# Patient Record
Sex: Female | Born: 1995 | Race: White | Hispanic: No | Marital: Single | Smoking: Never smoker
Health system: Southern US, Community
[De-identification: ages and names within clinical notes are randomized; demographics above are authoritative.]

## PROBLEM LIST (undated history)

## (undated) DIAGNOSIS — M629 Disorder of muscle, unspecified: Secondary | ICD-10-CM

---
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
## 2015-04-27 ENCOUNTER — Encounter (HOSPITAL_COMMUNITY): Payer: Self-pay | Admitting: *Deleted

## 2015-04-27 ENCOUNTER — Emergency Department (HOSPITAL_COMMUNITY)
Admission: EM | Admit: 2015-04-27 | Discharge: 2015-04-27 | Disposition: A | Discharge status: Home / Self Care | Payer: Managed Care, Other (non HMO) | Attending: Emergency Medicine | Admitting: Emergency Medicine

## 2015-04-27 DIAGNOSIS — M542 Cervicalgia: Secondary | ICD-10-CM | POA: Insufficient documentation

## 2015-04-27 DIAGNOSIS — R509 Fever, unspecified: Secondary | ICD-10-CM | POA: Diagnosis not present

## 2015-04-27 DIAGNOSIS — R110 Nausea: Secondary | ICD-10-CM | POA: Insufficient documentation

## 2015-04-27 DIAGNOSIS — R42 Dizziness and giddiness: Secondary | ICD-10-CM | POA: Diagnosis not present

## 2015-04-27 DIAGNOSIS — H9203 Otalgia, bilateral: Secondary | ICD-10-CM | POA: Diagnosis present

## 2015-04-27 HISTORY — DX: Disorder of muscle, unspecified: M62.9

## 2015-04-27 MED ORDER — MECLIZINE HCL 25 MG PO TABS: 25.0000 mg | ORAL_TABLET | Freq: Three times a day (TID) | ORAL | Status: DC | PRN

## 2015-04-27 NOTE — Discharge Instructions (Signed)

## 2015-04-27 NOTE — ED Notes (Addendum)
Patient states has had bilateral ear pain and pressure x6 days.  The pain gradually moved into her posterior neck.  Patient also endorses dizziness with or without movement.  Patient denies vomiting, but endorses nausea and states she had a temperature of 100.5 on Friday (10/22).  Patient denies photophobia and otorrhea.  Patient believes she has had the meningococcal vaccination.  Patient has treated pain with Advil with little relief.  No topicals.

## 2015-04-27 NOTE — ED Provider Notes (Signed)
CSN: [id number]     Arrival date & time 04/27/15  1238 History  By signing my name below, I, Anne Richards, attest that this documentation has been prepared under the direction and in the presence of Langston Masker, PA-C Electronically Signed: Soijett Richards, ED Scribe. 04/27/2015. 1:23 PM.   Chief Complaint  Patient presents with  . Otalgia  . Neck Pain      The history is provided by the patient. No language interpreter was used.    Anne Richards is a 19 y.o. female who presents to the Emergency Department complaining of bilateral ear pain/pressure onset 6 days. She notes that her bilateral neck pain has radiated to her neck with a sensation of tightness. She states that she is having associated symptoms of dizziness that feels like her head is spinning, fever of 100.5 2 days ago, and nausea. She states that she has tried Geneticist, molecular for her symptoms. She denies chills, color change, rash, wound, and any other symptoms. 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 Pt PCP is Dr. Abundio Miu in [place] and she goes to [employer].   Past Medical History  Diagnosis Date  . Muscle disorder     no specific dx, sx fatigue, muscle aches, headaches   History reviewed. No pertinent past surgical history. No family history on file. Social History  Substance Use Topics  . Smoking status: Never Smoker   . Smokeless tobacco: Never Used  . Alcohol Use: No   OB History    No data available     Review of Systems  Constitutional: Positive for fever.  HENT: Positive for ear pain.   Gastrointestinal: Positive for nausea. Negative for vomiting.  Musculoskeletal: Positive for neck pain (soreness).  Skin: Negative for color change, rash and wound.  Neurological: Positive for dizziness.      Allergies  Review of patient's allergies indicates no known allergies.  Home Medications    Prior to Admission medications   Medication Sig Start Date End Date Taking? Authorizing Provider  ibuprofen (ADVIL) 200 MG tablet Take 600 mg by mouth every 6 (six) hours as needed for moderate pain.   Yes Historical Provider, MD   BP 135/78 mmHg  Pulse 70  Temp(Src) 98.3 F (36.8 C)  Resp 18  SpO2 100%  LMP 03/23/2015 Physical Exam  Constitutional: She is oriented to person, place, and time. She appears well-developed and well-nourished. No distress.  HENT:  Head: Normocephalic and atraumatic.  Left Ear: Tympanic membrane normal.  Fluid behind right TM. Left TM nl.  Eyes: EOM are normal.  Neck: Normal range of motion. Neck supple.  Cardiovascular: Normal rate, regular rhythm and normal heart sounds.  Exam reveals no gallop and no friction rub.   No murmur heard. Pulmonary/Chest: Effort normal and breath sounds normal. No respiratory distress. She has no wheezes. She has no rales.  Musculoskeletal: Normal range of motion.  Neurological: She is alert and oriented to person, place, and time.  Skin: Skin is warm and dry.  Psychiatric: She has a normal mood and affect. Her behavior is normal.  Nursing note and vitals reviewed.   ED Course  Procedures (including critical care time) DIAGNOSTIC STUDIES: Oxygen Saturation is 100% on RA, nl by my interpretation.    COORDINATION OF CARE: 1:22 PM Discussed treatment plan with pt at bedside which includes antivert Rx and return if the symptoms worsen  or persist and pt agreed to plan.    Labs Review Labs Reviewed - No data to display  Imaging Review No results found.    EKG Interpretation None      MDM   Final diagnoses:  Vertigo    antivert [facility] 2 days  Elson AreasLeslie K Vergene Marland, PA-C 04/27/15 1333  Gwyneth SproutWhitney Plunkett, MD 04/27/15 [phone]

## 2017-04-05 ENCOUNTER — Encounter: Payer: Self-pay | Admitting: Urgent Care

## 2017-04-05 ENCOUNTER — Ambulatory Visit (INDEPENDENT_AMBULATORY_CARE_PROVIDER_SITE_OTHER): Payer: Managed Care, Other (non HMO)

## 2017-04-05 ENCOUNTER — Ambulatory Visit (INDEPENDENT_AMBULATORY_CARE_PROVIDER_SITE_OTHER): Payer: Managed Care, Other (non HMO) | Admitting: Urgent Care

## 2017-04-05 VITALS — BP 122/80 | HR 83 | Temp 98.4°F | Resp 16 | Ht 67.0 in | Wt 168.2 lb

## 2017-04-05 DIAGNOSIS — F0781 Postconcussional syndrome: Secondary | ICD-10-CM

## 2017-04-05 DIAGNOSIS — S0990XA Unspecified injury of head, initial encounter: Secondary | ICD-10-CM

## 2017-04-05 NOTE — Progress Notes (Signed)
    MRN: [id number] DOB: 1996-05-13  Subjective:   Anne Richards is a 21 y.o. female presenting for chief complaint of Concussion (possible concussion; hit in head 1 week ago); Dizziness (from hit in head ); and lump (lump behind right ear; possible from hit)  Reports 1 week history of trauma to the head while playing volleyball. She was warming up and suffered two hits to the posterior head and upper neck twice. She had progressive symptoms over the next 2 days that have steadily improved up to today. Symptoms include mild confusion, disorientation, intermittent dizziness, neck pain, pain over posterior lateral head behind right ear. Has also had photosensitivity, right facial tingling. Has a history of concussions, this would be 4th episode. Denies fevers, double vision, loss of consciousness, headaches, lethargy, falls, weakness, difficulty ambulating, difficulty with balance. She has rested from volleyball, had her mid-terms rescheduled this past week. She is not hydrating well.   Leighann is not currently taking any medications. Also has No Known Allergies.  Toree  has a past medical history of Muscle disorder. Denies past surgical history.  Objective:   Vitals: BP 122/80   Pulse 83   Temp 98.4 F (36.9 C) (Oral)   Resp 16   Ht  (1.702 m)   Wt 168 lb 3.2 oz (76.3 kg)   SpO2 100%   BMI 26.34 kg/m   Physical Exam  Constitutional: She is oriented to person, place, and time. She appears well-developed and well-nourished.  HENT:  TM's intact bilaterally, no effusions or erythema. Oropharynx clear, mucous membranes moist.  Eyes: Pupils are equal, round, and reactive to light. EOM are normal. No scleral icterus.  Neck: Normal range of motion. Neck supple.  Cardiovascular: Normal rate, regular rhythm and intact distal pulses.  Exam reveals no gallop and no friction rub.   No murmur heard. Pulmonary/Chest: Effort normal. No respiratory distress. She has no wheezes. She has no rales.    Musculoskeletal:       Cervical back: She exhibits normal range of motion, no tenderness, no bony tenderness, no swelling, no edema, no deformity, no laceration, no pain, no spasm and normal pulse.  Neurological: She is alert and oriented to person, place, and time. She displays normal reflexes. No cranial nerve deficit. Coordination normal.  Heel-to-shin, finger-to-nose, balance intact.  Skin: Skin is warm and dry. Capillary refill takes less than 2 seconds. No rash noted.  Psychiatric: She has a normal mood and affect.   Dg Skull Complete  Result Date: 04/05/2017 CLINICAL DATA:  Posterior head injury EXAM: SKULL - COMPLETE 4 + VIEW COMPARISON:  None. FINDINGS: There is no evidence of skull fracture or other focal bone lesions. IMPRESSION: Negative.  CT follow-up as indicated. Electronically Signed   By: Jasmine Pang M.D.   On: 04/05/2017 16:36   Assessment and Plan :   1. Post concussion syndrome 2. Injury of head, initial encounter - Patient has excellent physical exam findings. Anticipatory guidance provided. Patient provided with school note. Progressive return to sports as dictated by her program. Return-to-clinic precautions discussed, patient verbalized understanding.   Wallis Bamberg, PA-C Primary Care at [facility] [phone] 04/05/2017  4:03 PM

## 2017-04-05 NOTE — Patient Instructions (Addendum)
Make sure you are hydrating very well. Allow for brain rest. Use naproxen 200-440mg  (1-2 tablets) twice daily with food.    Concussion, Adult A concussion is a brain injury from a direct hit (blow) to the head or body. This injury causes the brain to shake quickly back and forth inside the skull. It is caused by:  A hit to the head.  A quick and sudden movement (jolt) of the head or neck.  How fast you will get better from a concussion depends on many things like how bad your concussion was, what part of your brain was hurt, how old you are, and how healthy you were before the concussion. Recovery can take time. It is important to wait to return to activity until a doctor says it is safe and your symptoms are all gone. Follow these instructions at home: Activity  Limit activities that need a lot of thought or concentration. These include: ? Homework or work for your job. ? Watching TV. ? Computer work. ? Playing memory games and puzzles.  Rest. Rest helps the brain to heal. Make sure you: ? Get plenty of sleep at night. Do not stay up late. ? Go to bed at the same time every day. ? Rest during the day. Take naps or rest breaks when you feel tired.  It can be dangerous if you get another concussion before the first one has healed Do not do activities that could cause a second concussion, such as riding a bike or playing sports.  Ask your doctor when you can return to your normal activities, like driving, riding a bike, or using machinery. Your ability to react may be slower. Do not do these activities if you are dizzy. Your doctor will likely give you a plan for slowly going back to activities. General instructions  Take over-the-counter and prescription medicines only as told by your doctor.  Do not drink alcohol until your doctor says you can.  If it is harder than usual to remember things, write them down.  If you are easily distracted, try to do one thing at a time. For  example, do not try to watch TV while making dinner.  Talk with family members or close friends when you need to make important decisions.  Watch your symptoms and tell other people to do the same. Other problems (complications) can happen after a concussion. Older adults with a brain injury may have a higher risk of serious problems, such as a blood clot in the brain.  Tell your teachers, school nurse, school counselor, coach, Event organiser, or work Production designer, theatre/television/film about your injury and symptoms. Tell them about what you can or cannot do. They should watch for: ? More problems with attention or concentration. ? More trouble remembering or learning new information. ? More time needed to do tasks or assignments. ? Being more annoyed (irritable) or having a harder time dealing with stress. ? Any other symptoms that get worse.  Keep all follow-up visits as told by your health care provider. This is important. Prevention  It is very important that you donot get another brain injury, especially before you have healed. In rare cases, another injury can cause permanent brain damage, brain swelling, or death. You have the most risk if you get another head injury in the first 7-10 days after you were hurt before. To avoid injuries: ? Wear a seat belt when you ride in a car. ? Do not drink too much alcohol. ? Avoid activities  that could make you get a second concussion, like contact sports. ? Wear a helmet when you do activities like:  Biking.  Skiing.  Skateboarding.  Skating. ? Make your home safe by:  Removing things from the floor or stairs that could make you trip.  Using grab bars in bathrooms and handrails by stairs.  Placing non-slip mats on floors and in bathtubs.  Putting more light in dark areas. Contact a doctor if:  Your symptoms get worse.  You have new symptoms.  You keep having symptoms for more than 2 weeks. Get help right away if:  You have bad headaches, or your  headaches get worse.  You have weakness in any part of your body.  You have loss of feeling (numbness).  You feel off balance.  You keep throwing up (vomiting).  You feel more sleepy.  The black center of one eye (pupil) is bigger than the other one.  You twitch or shake violently (convulse) or have a seizure.  Your speech is not clear (is slurred).  You feel more tired, more confused, or more annoyed.  You do not recognize people or places.  You have neck pain.  It is hard to wake you up.  You have strange behavior changes.  You pass out (lose consciousness). Summary  A concussion is a brain injury from a direct hit (blow) to the head or body.  This condition is treated with rest and careful watching of symptoms.  If you keep having symptoms for more than 2 weeks, call your doctor. This information is not intended to replace advice given to you by your health care provider. Make sure you discuss any questions you have with your health care provider. Document Released: 06/09/2009 Document Revised: 06/05/2016 Document Reviewed: 06/05/2016 Elsevier Interactive Patient Education  2017 [employer].     IF you received an x-ray today, you will receive an invoice from [facility]. Please contact [facility] at [phone] with questions or concerns regarding your invoice.   IF you received labwork today, you will receive an invoice from [facility]. Please contact [facility] at [phone] with questions or concerns regarding your invoice.   Our billing staff will not be able to assist you with questions regarding bills from these companies.  You will be contacted with the lab results as soon as they are available. The fastest way to get your results is to activate your My Chart account. Instructions are located on the last page of this paperwork. If you have not heard from [place] regarding the results in 2 weeks, please contact this office.

## 2017-04-27 ENCOUNTER — Emergency Department (HOSPITAL_COMMUNITY): Payer: Managed Care, Other (non HMO)

## 2017-04-27 ENCOUNTER — Emergency Department (HOSPITAL_COMMUNITY)
Admission: EM | Admit: 2017-04-27 | Discharge: 2017-04-27 | Disposition: A | Discharge status: Home / Self Care | Payer: Managed Care, Other (non HMO) | Attending: Physician Assistant | Admitting: Physician Assistant

## 2017-04-27 ENCOUNTER — Encounter (HOSPITAL_COMMUNITY): Payer: Self-pay | Admitting: Emergency Medicine

## 2017-04-27 DIAGNOSIS — F0781 Postconcussional syndrome: Secondary | ICD-10-CM | POA: Insufficient documentation

## 2017-04-27 DIAGNOSIS — R51 Headache: Secondary | ICD-10-CM | POA: Insufficient documentation

## 2017-04-27 NOTE — ED Triage Notes (Signed)
Pt complaint of worsening headache since earlier October with palpation and growth "of lump behind my right ear."

## 2017-04-27 NOTE — ED Provider Notes (Signed)
[facility]-EMERGENCY DEPT Provider Note   CSN: [id number] Arrival date & time: 04/27/17  1355     History   Chief Complaint Chief Complaint  Patient presents with  . Headache    HPI Anne Richards is a 21 y.o. female who presents the emergency department today for headache.  Patient states that she was initially hit in head by volleyball x 2 on September 26 while at game for college.  She was diagnosed by her schools doctor with a concussion.  Over the next several days she started noticing a bump on the back of her right ear (where she as hit) with continued headache, confusion, disorientation, intermittent dizziness, and neck pain. She was seen at urgent care on 04/05/17 where a skull x-ray was performed and found no evidence of fracture or focal bony lesion.  At this time the patient was neurologically intact and was diagnosed with post-concussion symptoms.  The patient states that her symptoms started to resolve/resolved 1 week later (October 7).  She was cleared for play on October 15 by her athletic trainer is it present with her today.  She played her first  game on the 20th of October and felt her symptoms worsen yet again. No re-trauma.  She has now  been having continued right-sided, throbbing headache with associated phonophobia that she rates as a 4/10, nausea, pain located behind the right ear, and intermittent dizziness that she describes as unbalanced (no vertigo). The patient has a history of concussions and states this was her fourth.  She denies any fevers, chills, emesis, double vision, visual changes, loss of consciousness, lethargy, upper extremity or lower extremity weakness/numbness/tingling, difficulty walking. The patient is not on blood thinners.    HPI  Past Medical History:  Diagnosis Date  . Muscle disorder    no specific dx, sx fatigue, muscle aches, headaches    There are no active problems to display for this patient.   History reviewed.  No pertinent surgical history.  OB History    No data available       Home Medications    Prior to Admission medications   Not on File    Family History No family history on file.  Social History Social History  Substance Use Topics  . Smoking status: Never Smoker  . Smokeless tobacco: Never Used  . Alcohol use No     Allergies   Patient has no known allergies.   Review of Systems Review of Systems  All other systems reviewed and are negative.    Physical Exam Updated Vital Signs BP 126/81 (BP Location: Right Arm)   Pulse 72   Temp 97.8 F (36.6 C) (Oral)   Resp 16   LMP 04/24/2017   SpO2 100%   Physical Exam  Constitutional: She appears well-developed and well-nourished.  Non-toxic appearing  HENT:  Head: Normocephalic and atraumatic. Head is without raccoon's eyes and without Battle's sign.  Right Ear: Hearing, tympanic membrane, external ear and ear canal normal. Tympanic membrane is not perforated and not erythematous. No hemotympanum.  Left Ear: Hearing, tympanic membrane, external ear and ear canal normal. Tympanic membrane is not perforated and not erythematous. No hemotympanum.  Nose: Nose normal. No rhinorrhea. Right sinus exhibits no maxillary sinus tenderness and no frontal sinus tenderness. Left sinus exhibits no maxillary sinus tenderness and no frontal sinus tenderness.  Mouth/Throat: Uvula is midline, oropharynx is clear and moist and mucous membranes are normal.  No CSF otorrhea.  No palpable open  or depressed skull fracture.  The patient is without mastoid tenderness or swelling.  There is no erythema or bogginess.  Eyes: Pupils are equal, round, and reactive to light. Conjunctivae, EOM and lids are normal. Right eye exhibits no discharge. Left eye exhibits no discharge. Right conjunctiva is not injected. Left conjunctiva is not injected. No scleral icterus. Right eye exhibits normal extraocular motion and no nystagmus. Left eye exhibits normal  extraocular motion and no nystagmus.  Neck: Trachea normal, normal range of motion, full passive range of motion without pain and phonation normal. Neck supple. No spinous process tenderness and no muscular tenderness present. No neck rigidity. No tracheal deviation and normal range of motion present.  Cardiovascular: Normal rate, regular rhythm, normal heart sounds and intact distal pulses.   Pulses:      Radial pulses are 2+ on the right side, and 2+ on the left side.       Dorsalis pedis pulses are 2+ on the right side, and 2+ on the left side.       Posterior tibial pulses are 2+ on the right side, and 2+ on the left side.  Pulmonary/Chest: Effort normal and breath sounds normal. No respiratory distress.  Musculoskeletal:  Cervical Spine: Appearance normal. No obvious bony deformity. No skin swelling, erythema, heat, fluctuance or break of the skin. No TTP over the cervical spinous processes. No paraspinal tenderness. No step-offs. Patient is able to actively rotate their neck 45 degrees left and right voluntarily without pain and flex and extend the neck without pain. Negative Spurling's test.  Radial Pulse 2+. Cap refill <2 seconds. SILT for M/U/R distributions. Compartments soft.    Neurological: She is alert. She has normal strength and normal reflexes. No cranial nerve deficit or sensory deficit. She displays a negative Romberg sign. Coordination and gait normal.  Reflex Scores:      Bicep reflexes are 2+ on the right side and 2+ on the left side.      Patellar reflexes are 2+ on the right side and 2+ on the left side.      Achilles reflexes are 2+ on the right side and 2+ on the left side. Mental Status: Alert, oriented, thought content appropriate, able to give a coherent history. Speech fluent without evidence of aphasia. Able to follow 2 step commands without difficulty. Cranial Nerves: II: Peripheral visual fields grossly normal, pupils equal, round, reactive to light III,IV,  VI: ptosis not present, extra-ocular motions intact bilaterally V,VII: smile symmetric, eyebrows raise symmetric, facial light touch sensation equal VIII: hearing grossly normal to voice X: uvula elevates symmetrically XI: bilateral shoulder shrug symmetric and strong XII: midline tongue extension without fassiculations Motor: Normal tone. 5/5 in upper and lower extremities bilaterally including strong and equal grip strength and dorsiflexion/plantar flexion Sensory: Sensation intact to light touch in all extremities.Negative Romberg.  Deep Tendon Reflexes: 2+ and symmetric in the biceps and patella Cerebellar: normal finger-to-nose with bilateral upper extremities. Normal heel-to -shin balance bilaterally of the lower extremity. No pronator drift.  Gait: normal gait and balance CV: distal pulses palpable throughout   Skin: Skin is warm, dry and intact. She is not diaphoretic. No pallor.  Psychiatric: She has a normal mood and affect.  Nursing note and vitals reviewed.    ED Treatments / Results  Labs (all labs ordered are listed, but only abnormal results are displayed) Labs Reviewed - No data to display  EKG  EKG Interpretation None       Radiology Ct  Head Wo Contrast  Result Date: 04/27/2017 CLINICAL DATA:  Post concussion syndrome, suffered a concussion 4 weeks ago, for concussions in past, 2 in the last 6 months EXAM: CT HEAD WITHOUT CONTRAST TECHNIQUE: Contiguous axial images were obtained from the base of the skull through the vertex without intravenous contrast. COMPARISON:  None FINDINGS: Brain: Normal ventricular morphology. No midline shift or mass effect. Normal appearance of brain parenchyma. No intracranial hemorrhage, mass lesion, or evidence of acute infarction. No extra-axial fluid collections. Vascular: Normal appearance Skull: Normal appearance Sinuses/Orbits: Normal appearance Other: N/A IMPRESSION: Normal exam. Electronically Signed   By: Ulyses Southward  M.D.   On: 04/27/2017 17:44    Procedures Procedures (including critical care time)  Medications Ordered in ED Medications - No data to display   Initial Impression / Assessment and Plan / ED Course  I have reviewed the triage vital signs and the nursing notes.  Pertinent labs & imaging results that were available during my care of the patient were reviewed by me and considered in my medical decision making (see chart for details).      Patient with continued post-concussive like symptoms. Patient with no focal neurological deficits on physical exam.  Discussed the likely etiology of patient's symptoms being postconcussive syndrome and advised CT is not indicated at this time.  However the patient and her athletic trainer stated that they would not like to leave the hospital until a CT scan is done as this is the patient's fourth concussion and she is continuing of symptoms.  She is aware of the risks versus benefits of this.    CT scan negative.   Discussed thoroughly symptoms to return to the emergency department including severe headaches, disequilibrium, vomiting, double vision, extremity weakness, difficulty ambulating, or any other concerning symptoms. Patient will be discharged with information pertaining to diagnosis and advised to use over-the-counter medications like NSAIDs and Tylenol for pain relief. Pt has also advised to not participate in contact sports until they are completely asymptomatic for at least 1 week or they are cleared by their doctor.    Final Clinical Impressions(s) / ED Diagnoses   Final diagnoses:  Post concussion syndrome    New Prescriptions New Prescriptions   No medications on file     Princella Pellegrini 04/27/17 Luna Glasgow, MD 04/27/17 [phone]

## 2017-04-27 NOTE — ED Notes (Signed)
Pt states that she had a concussion in Sept 28th and reports that she is c/o concussion like symptoms x 3 weeks which include nausea, no vomiting, HA, dizziness right side neck pain, pt states that she was hit in the head with a volleyball and that is how she sustained a concussion 4 weeks ago.

## 2017-04-27 NOTE — Discharge Instructions (Signed)
I have attached information on postconcussion syndrome.  Please use Tylenol and ibuprofen as needed for symptoms.  I have referred you to a neurologist as needed.  Please do not return to activity until you are cleared for play. If you develop worsening or new concerning symptoms you can return to the emergency department for re-evaluation.

## 2017-04-28 ENCOUNTER — Other Ambulatory Visit: Payer: Self-pay

## 2017-04-28 DIAGNOSIS — F0781 Postconcussional syndrome: Secondary | ICD-10-CM

## 2017-04-28 DIAGNOSIS — H938X1 Other specified disorders of right ear: Secondary | ICD-10-CM

## 2017-05-05 ENCOUNTER — Encounter: Payer: Self-pay | Admitting: *Deleted

## 2017-05-05 ENCOUNTER — Encounter: Payer: Self-pay | Admitting: Neurology

## 2017-05-05 ENCOUNTER — Ambulatory Visit (INDEPENDENT_AMBULATORY_CARE_PROVIDER_SITE_OTHER): Payer: Managed Care, Other (non HMO) | Admitting: Neurology

## 2017-05-05 VITALS — BP 107/75 | HR 78 | Wt 172.4 lb

## 2017-05-05 DIAGNOSIS — H539 Unspecified visual disturbance: Secondary | ICD-10-CM

## 2017-05-05 DIAGNOSIS — G9389 Other specified disorders of brain: Secondary | ICD-10-CM

## 2017-05-05 DIAGNOSIS — S069X0A Unspecified intracranial injury without loss of consciousness, initial encounter: Secondary | ICD-10-CM | POA: Diagnosis not present

## 2017-05-05 DIAGNOSIS — R29818 Other symptoms and signs involving the nervous system: Secondary | ICD-10-CM | POA: Diagnosis not present

## 2017-05-05 DIAGNOSIS — F0781 Postconcussional syndrome: Secondary | ICD-10-CM

## 2017-05-05 DIAGNOSIS — R51 Headache: Secondary | ICD-10-CM | POA: Diagnosis not present

## 2017-05-05 DIAGNOSIS — R5383 Other fatigue: Secondary | ICD-10-CM | POA: Diagnosis not present

## 2017-05-05 DIAGNOSIS — G8929 Other chronic pain: Secondary | ICD-10-CM

## 2017-05-05 NOTE — Patient Instructions (Signed)
Post-Concussion Syndrome Post-concussion syndrome describes the symptoms that can occur after a head injury. These symptoms can last from weeks to months. What are the causes? It is not clear why some head injuries cause post-concussion syndrome. It can occur whether your head injury was mild or severe and whether you were wearing head protection or not. What are the signs or symptoms?  Memory difficulties.  Dizziness.  Headaches.  Double vision or blurry vision.  Sensitivity to light.  Hearing difficulties.  Depression.  Tiredness.  Weakness.  Difficulty with concentration.  Difficulty sleeping or staying asleep.  Vomiting.  Poor balance or instability on your feet.  Slow reaction time.  Difficulty learning and remembering things you have heard. How is this diagnosed? There is no test to determine whether you have post-concussion syndrome. Your health care provider may order an imaging scan of your brain, such as a CT scan, to check for other problems that may be causing your symptoms (such as a severe injury inside your skull). How is this treated? Usually, these problems disappear over time without medical care. Your health care provider may prescribe medicine to help ease your symptoms. It is important to follow up with a neurologist to evaluate your recovery and address any lingering symptoms or issues. Follow these instructions at home:  Take medicines only as directed by your health care provider. Do not take aspirin. Aspirin can slow blood clotting.  Sleep with your head slightly elevated to help with headaches.  Avoid any situation where there is potential for another head injury. This includes football, hockey, soccer, basketball, martial arts, downhill snow sports, and horseback riding. Your condition will get worse every time you experience a concussion. You should avoid these activities until you are evaluated by the appropriate follow-up health care  providers.  Keep all follow-up visits as directed by your health care provider. This is important. Contact a health care provider if:  You have increased problems paying attention or concentrating.  You have increased difficulty remembering or learning new information.  You need more time to complete tasks or assignments than before.  You have increased irritability or decreased ability to cope with stress.  You have more symptoms than before. Seek medical care if you have any of the following symptoms for more than two weeks after your injury:  Lasting (chronic) headaches.  Dizziness or balance problems.  Nausea.  Vision problems.  Increased sensitivity to noise or light.  Depression or mood swings.  Anxiety or irritability.  Memory problems.  Difficulty concentrating or paying attention.  Sleep problems.  Feeling tired all the time.  Get help right away if:  You have confusion or unusual drowsiness.  Others find it difficult to wake you up.  You have nausea or persistent, forceful vomiting.  You feel like you are moving when you are not (vertigo). Your eyes may move rapidly back and forth.  You have convulsions or faint.  You have severe, persistent headaches that are not relieved by medicine.  You cannot use your arms or legs normally.  One of your pupils is larger than the other.  You have clear or bloody discharge from your nose or ears.  Your problems are getting worse, not better. This information is not intended to replace advice given to you by your health care provider. Make sure you discuss any questions you have with your health care provider. Document Released: 12/11/2001 Document Revised: 01/09/2016 Document Reviewed: 09/26/2013 Elsevier Interactive Patient Education  2018 Elsevier Inc.  

## 2017-05-05 NOTE — Progress Notes (Signed)
[name]    Provider:  Dr Lucia Gaskins Referring Provider: [facility] dr draper  CC:  concussion  HPI:  Anne Richards is a 21 y.o. female here as a referral  for concussion. Here with her coach that provides much information.  She has had 4 concussions. One in 8th grade, it was severe and loss of consciousness for < 30 mins her head hit the ground as she was running for a ball and fell, she was taken out of sports for months and stopped softball. She has concussion hit by volleyball in the head. Last spring was third concussion and got hit in the back of the head with a ball and was out of school for a week, no loss of consciousness, no vomiting at the time, no memory loss. This one happened in September, a serve got hit in the back of the head. She felt better in a week and then relapsed. Initially she had headaches, irritability and mood change, personality change, depression and anxiety, (no history of depression and anxiety), more "sassy", headaches, ear pain, memory, concentration, confusion, dizziness, right ear pain like an ear infection. She saw the ENT on Tuesday. She has had hearing sensitivity. She sleeps well and too much some night and not so much other nights, she is tired all the time.   Reviewed notes, labs and imaging from outside physicians, which showed:    CT head 04/27/2017 showed No acute intracranial abnormalities including mass lesion or mass effect, hydrocephalus, extra-axial fluid collection, midline shift, hemorrhage, or acute infarction, large ischemic events (personally reviewed images)     Review of Systems: Patient complains of symptoms per HPI as well as the following symptoms: diplopia. Pertinent negatives and positives per HPI. All others negative.   Social History   Socioeconomic History  . Marital status: Single    Spouse name: Not on file  . Number of children: Not on file  . Years of education: some college  . Highest  education level: Not on file  Social Needs  . Financial resource strain: Not on file  . Food insecurity - worry: Not on file  . Food insecurity - inability: Not on file  . Transportation needs - medical: Not on file  . Transportation needs - non-medical: Not on file  Occupational History  . Occupation: Consulting civil engineer  Tobacco Use  . Smoking status: Never Smoker  . Smokeless tobacco: Never Used  Substance and Sexual Activity  . Alcohol use: Yes    Comment: occasionally; 1 drink last 2 months  . Drug use: No  . Sexual activity: Not on file  Other Topics Concern  . 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 Dementia Paternal Grandmother   . Diabetes Maternal Grandfather     Past Medical History:  Diagnosis Date  . Muscle disorder    no specific dx, sx fatigue, muscle aches, headaches; autoimmune per pt     Past Surgical History:  Procedure Laterality Date  . MUSCLE BIOPSY  2013    No current outpatient medications on file.   No current facility-administered medications for this visit.     Allergies as of 05/05/2017  . (No Known Allergies)    Vitals: BP 107/75   Pulse 78   Wt 172 lb 6.4 oz (  78.2 kg)   LMP 04/24/2017   BMI 27.00 kg/m  Last Weight:  Wt Readings from Last 1 Encounters:  05/05/17 172 lb 6.4 oz (78.2 kg)   Last Height:   Ht Readings from Last 1 Encounters:  04/05/17 5\' 7"  (1.702 m)   Physical exam: Exam: Gen: NAD, conversant, well nourised, well groomed                     CV: RRR, no MRG. No Carotid Bruits. No peripheral edema, warm, nontender Eyes: Conjunctivae clear without exudates or hemorrhage  Neuro: Detailed Neurologic Exam  Speech:    Speech is normal; fluent and spontaneous with normal comprehension.  Cognition:    The patient is oriented to person, place, and time;     recent and remote  memory intact;     language fluent;     normal attention, concentration,     fund of knowledge Cranial Nerves:    The pupils are equal, round, and reactive to light. The fundi are normal and spontaneous venous pulsations are present. Visual fields are full to finger confrontation. Extraocular movements are intact. Trigeminal sensation is intact and the muscles of mastication are normal. The face is symmetric. The palate elevates in the midline. Hearing intact. Voice is normal. Shoulder shrug is normal. The tongue has normal motion without fasciculations.   Coordination:    Normal finger to nose and heel to shin. Normal rapid alternating movements.   Gait:    Heel-toe and tandem gait are normal.   Motor Observation:    No asymmetry, no atrophy, and no involuntary movements noted. Tone:    Normal muscle tone.    Posture:    Posture is normal. normal erect    Strength:    Strength is V/V in the upper and lower limbs.      Sensation: intact to LT     Reflex Exam:  DTR's:    Deep tendon reflexes in the upper and lower extremities are normal bilaterally.   Toes:    The toes are downgoing bilaterally.   Clonus:    Clonus is absent.   Concussion center MRI brain      Assessment/Plan:  21 year old with head trauma, concussion syndrome. Discussed with patient at length. Rest is important in concussion recovery. Recommend shortened school days, staying home and resting , taking frequent breaks. No strenuous activity, limiting computer and reading time. Patient should not return to sports at least until the spring and only after follow up in our clinic. Encouraged patient to consider retiring from competitive sports.  Concussion clinic due to multiple sports-related concussions with worsening symptoms MRI brain to evaluate for encephalomalacia or other significant post traumatic brain lesions Labs today May write a letter for accommodations if patient requests  Orders Placed This  Encounter  Procedures  . MR BRAIN W WO CONTRAST  . CBC  . Comprehensive metabolic panel  . Thyroid Panel With TSH     Naomie DeanAntonia Damyan Corne, MD  [facility] [address] [place], [place] [id number]  Phone [phone] Fax [phone]  Cc: Dr. Margaretha Sheffieldraper

## 2017-05-06 ENCOUNTER — Telehealth: Payer: Self-pay | Admitting: *Deleted

## 2017-05-06 LAB — COMPREHENSIVE METABOLIC PANEL
ALBUMIN: 4.7 g/dL (ref 3.5–5.5)
ALK PHOS: 58 IU/L (ref 39–117)
ALT: 12 IU/L (ref 0–32)
AST: 21 IU/L (ref 0–40)
Albumin/Globulin Ratio: 1.7 (ref 1.2–2.2)
BILIRUBIN TOTAL: 0.4 mg/dL (ref 0.0–1.2)
BUN / CREAT RATIO: 17 (ref 9–23)
BUN: 13 mg/dL (ref 6–20)
CO2: 24 mmol/L (ref 20–29)
Calcium: 9.5 mg/dL (ref 8.7–10.2)
Chloride: 101 mmol/L (ref 96–106)
Creatinine, Ser: 0.76 mg/dL (ref 0.57–1.00)
GFR calc Af Amer: 130 mL/min/{1.73_m2} (ref >59)
GFR calc non Af Amer: 112 mL/min/{1.73_m2} (ref >59)
GLUCOSE: 93 mg/dL (ref 65–99)
Globulin, Total: 2.8 g/dL (ref 1.5–4.5)
Potassium: 4 mmol/L (ref 3.5–5.2)
Sodium: 139 mmol/L (ref 134–144)
Total Protein: 7.5 g/dL (ref 6.0–8.5)

## 2017-05-06 LAB — CBC
HEMOGLOBIN: 14 g/dL (ref 11.1–15.9)
Hematocrit: 41.9 % (ref 34.0–46.6)
MCH: 29.8 pg (ref 26.6–33.0)
MCHC: 33.4 g/dL (ref 31.5–35.7)
MCV: 89 fL (ref 79–97)
PLATELETS: 269 10*3/uL (ref 150–379)
RBC: 4.7 x10E6/uL (ref 3.77–5.28)
RDW: 13.4 % (ref 12.3–15.4)
WBC: 7.7 10*3/uL (ref 3.4–10.8)

## 2017-05-06 LAB — THYROID PANEL WITH TSH
FREE THYROXINE INDEX: 1.7 (ref 1.2–4.9)
T3 Uptake Ratio: 26 % (ref 24–39)
T4, Total: 6.7 ug/dL (ref 4.5–12.0)
TSH: 1.32 u[IU]/mL (ref 0.450–4.500)

## 2017-05-06 NOTE — Telephone Encounter (Signed)
-----   Message from Antonia B Ahern, MD sent at 05/06/2017 11:20 AM EDT ----- Labs normal thanks 

## 2017-05-06 NOTE — Telephone Encounter (Signed)
Called pt, voicemail box full, unable to leave message.   If she calls back, please let her know that her labs are normal.

## 2017-05-08 DIAGNOSIS — F0781 Postconcussional syndrome: Secondary | ICD-10-CM | POA: Insufficient documentation

## 2017-05-09 ENCOUNTER — Telehealth: Payer: Self-pay | Admitting: *Deleted

## 2017-05-09 NOTE — Telephone Encounter (Signed)
-----   Message from Antonia B Ahern, MD sent at 05/06/2017 11:20 AM EDT ----- Labs normal thanks 

## 2017-05-09 NOTE — Telephone Encounter (Signed)
Called, unable to LVM due to mailbox being full. If she calls back please let her know that her labs are normal.

## 2017-05-10 ENCOUNTER — Telehealth: Payer: Self-pay | Admitting: *Deleted

## 2017-05-10 NOTE — Telephone Encounter (Signed)
-----   Message from Anson FretAntonia B Ahern, MD sent at 05/06/2017 11:20 AM EDT ----- Labs normal thanks

## 2017-05-10 NOTE — Telephone Encounter (Signed)
Called and spoke with pt. She verbalized understanding that labs are normal. She had no questions.

## 2017-05-16 ENCOUNTER — Encounter: Payer: Self-pay | Admitting: Neurology

## 2017-09-15 ENCOUNTER — Encounter: Payer: Self-pay | Admitting: Neurology

## 2017-09-15 ENCOUNTER — Ambulatory Visit (INDEPENDENT_AMBULATORY_CARE_PROVIDER_SITE_OTHER): Payer: Managed Care, Other (non HMO) | Admitting: Neurology

## 2017-09-15 VITALS — BP 119/75 | HR 74 | Ht 67.0 in | Wt 180.4 lb

## 2017-09-15 DIAGNOSIS — S060X0D Concussion without loss of consciousness, subsequent encounter: Secondary | ICD-10-CM

## 2017-09-15 NOTE — Progress Notes (Signed)
[name]    Provider:  Dr Lucia GaskinsAhern Referring Provider: [facility] dr draper  CC:  Concussion  Interval history : Patient significantly improved, here with her coach who also provides information. Ahe may have a mild tension headache once a week but not associated with activit. Her headaches are tension type in the forehead but this is not anything new, the headaches she had after the concussion have resolved, no problems with dizziness, vision changes, memory, mood, sleep. She has been lifting and intense cardio, no fatigue, no concussion symptoms. . Her concentration is improved. She will get protective head gear during games, prescription to biotech prosthesis given. . Discussed risks of concussions including chronic traumatic encephalopathy. At this time I will approve her return to sports without restrictions.   HPI:  Anne Richards is a 22 y.o. female here as a referral  for concussion. Here with her coach that provides much information.  She has had 4 concussions. One in 8th grade, it was severe and loss of consciousness for < 30 mins her head hit the ground as she was running for a ball and fell, she was taken out of sports for months and stopped softball. She has concussion hit by volleyball in the head. Last spring was third concussion and got hit in the back of the head with a ball and was out of school for a week, no loss of consciousness, no vomiting at the time, no memory loss. This one happened in September, a serve got hit in the back of the head. She felt better in a week and then relapsed. Initially she had headaches, irritability and mood change, personality change, depression and anxiety, (no history of depression and anxiety), more "sassy", headaches, ear pain, memory, concentration, confusion, dizziness, right ear pain like an ear infection. She saw the ENT on Tuesday. She has had hearing sensitivity. She sleeps well and too much some night and not so  much other nights, she is tired all the time.   Reviewed notes, labs and imaging from outside physicians, which showed:    CT head 04/27/2017 showed No acute intracranial abnormalities including mass lesion or mass effect, hydrocephalus, extra-axial fluid collection, midline shift, hemorrhage, or acute infarction, large ischemic events (personally reviewed images)      Review of Systems: Patient complains of symptoms per HPI as well as the following symptoms: diplopia. Pertinent negatives and positives per HPI. All others negative.   Social History   Socioeconomic History  . Marital status: Single    Spouse name: Not on file  . Number of children: Not on file  . Years of education: some college  . Highest education level: Not on file  Social Needs  . Financial resource strain: Not on file  . Food insecurity - worry: Not on file  . Food insecurity - inability: Not on file  . Transportation needs - medical: Not on file  . Transportation needs - non-medical: Not on file  Occupational History  . Occupation: Consulting civil engineerstudent  Tobacco Use  . Smoking status: Never Smoker  . Smokeless tobacco: Never Used  Substance and Sexual Activity  . Alcohol use: Yes    Comment: social, about 1 drink every 2-3 weeks  . Drug use: No  . Sexual activity: Not on file  Other Topics Concern  . 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 Alzheimer's disease Paternal Grandmother   . Diabetes Maternal Grandfather     Past Medical History:  Diagnosis Date  . Muscle disorder    no specific dx, sx fatigue, muscle aches, headaches; autoimmune per pt     Past Surgical History:  Procedure Laterality Date  . MUSCLE BIOPSY  2013    No current outpatient medications on file.   No current facility-administered medications for this visit.     Allergies as of  09/15/2017  . (No Known Allergies)    Vitals: BP 119/75 (BP Location: Right Arm, Patient Position: Sitting)   Pulse 74   Ht 5\' 7"  (1.702 m)   Wt 180 lb 6.4 oz (81.8 kg)   BMI 28.25 kg/m  Last Weight:  Wt Readings from Last 1 Encounters:  09/15/17 180 lb 6.4 oz (81.8 kg)   Last Height:   Ht Readings from Last 1 Encounters:  09/15/17 5\' 7"  (1.702 m)   Physical exam: Exam: Gen: NAD, conversant, well nourised, well groomed                     CV: RRR, no MRG. No Carotid Bruits. No peripheral edema, warm, nontender Eyes: Conjunctivae clear without exudates or hemorrhage  Neuro: Detailed Neurologic Exam  Speech:    Speech is normal; fluent and spontaneous with normal comprehension.  Cognition:    The patient is oriented to person, place, and time;     recent and remote memory intact;     language fluent;     normal attention, concentration,     fund of knowledge Cranial Nerves:    The pupils are equal, round, and reactive to light. The fundi are normal and spontaneous venous pulsations are present. Visual fields are full to finger confrontation. Extraocular movements are intact. Trigeminal sensation is intact and the muscles of mastication are normal. The face is symmetric. The palate elevates in the midline. Hearing intact. Voice is normal. Shoulder shrug is normal. The tongue has normal motion without fasciculations.   Coordination:    Normal finger to nose and heel to shin. Normal rapid alternating movements.   Gait:    Heel-toe and tandem gait are normal.   Motor Observation:    No asymmetry, no atrophy, and no involuntary movements noted. Tone:    Normal muscle tone.    Posture:    Posture is normal. normal erect    Strength:    Strength is V/V in the upper and lower limbs.      Sensation: intact to LT     Reflex Exam:  DTR's:    Deep tendon reflexes in the upper and lower extremities are normal bilaterally.   Toes:    The toes are downgoing bilaterally.    Clonus:    Clonus is absent.   Concussion center MRI brain      Assessment/Plan:  22 year old with resolution of concussion symptoms.  Discussed risks of concussions including chronic traumatic encephalopathy. At this time I will approve her return to sports without restrictions. Will send to a concussion center to follow her.   Naomie Dean, MD  [facility] [address] [place], [place] [address]  Phone [phone] Fax [phone]  Cc: Dr. Margaretha Sheffield  A total of 25 minutes was spent in with this patient. Over half this time was spent on counseling patient on the concussion  diagnosis and different therapeutic  options available.

## 2018-12-19 IMAGING — CT CT HEAD W/O CM
3 of 4 series · 15 of 47 positions shown, 18 images · non-contrast
Comparison: None

CLINICAL DATA: Post concussion syndrome, suffered a concussion 4
weeks ago, for concussions in past, 2 in the last 6 months

EXAM:
CT HEAD WITHOUT CONTRAST
TECHNIQUE: Contiguous axial images were obtained from the base of the skull
through the vertex without intravenous contrast.

[Series 2: head w/o · axial · non-contrast · 0.45mm/px · z∈[+1316,+1431]mm · 9 of 29 slices shown, 12 images]
[im 3/29  brain]
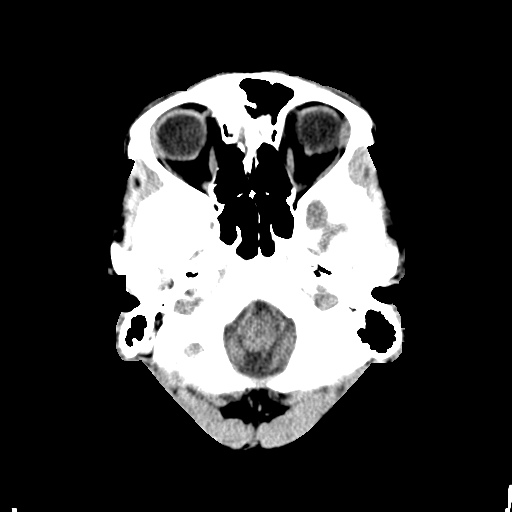
[im 3/29  bone]
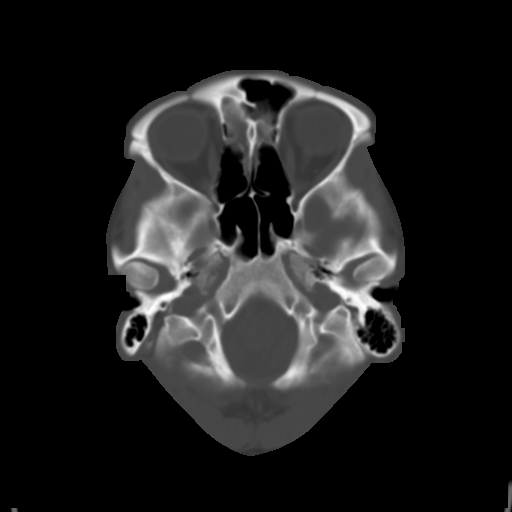
[im 6/29  brain]
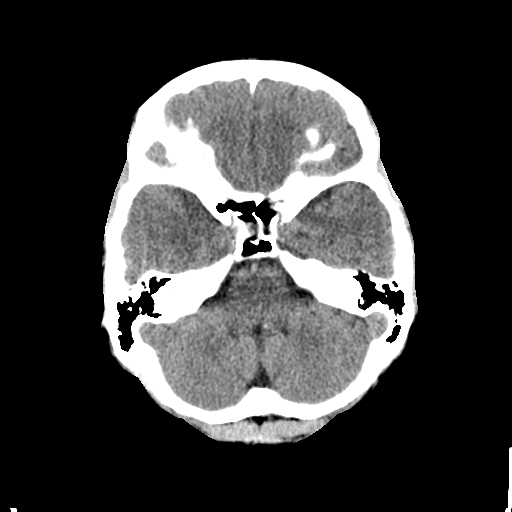
[im 9/29  brain]
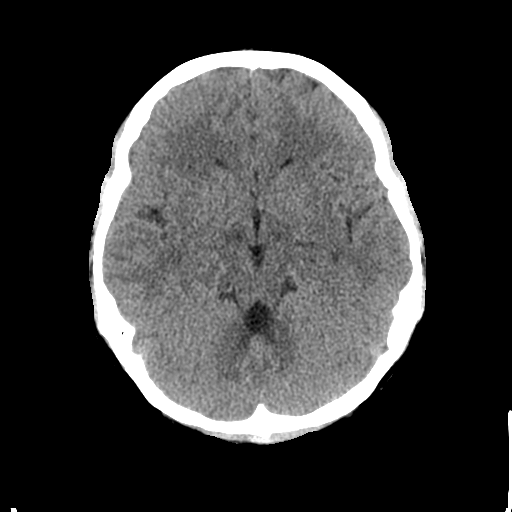
[im 12/29  brain]
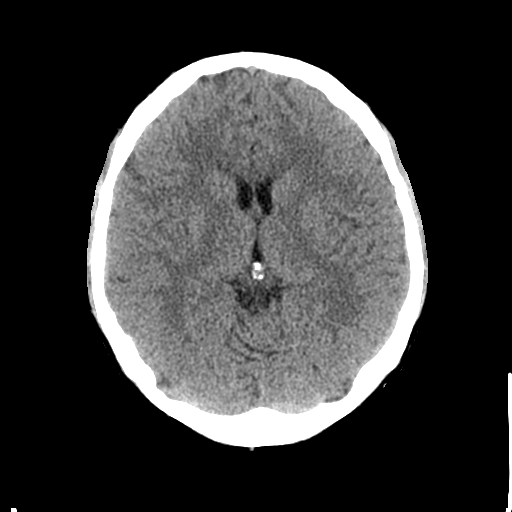
[im 15/29  brain]
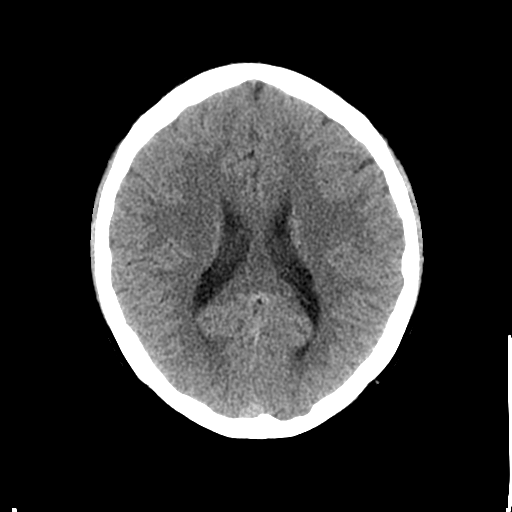
[im 15/29  bone]
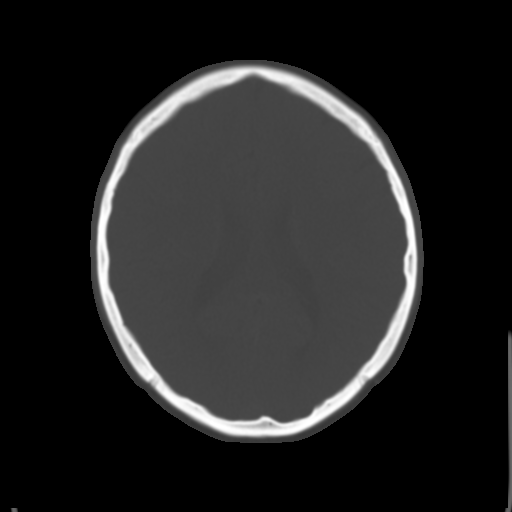
[im 17/29  brain]
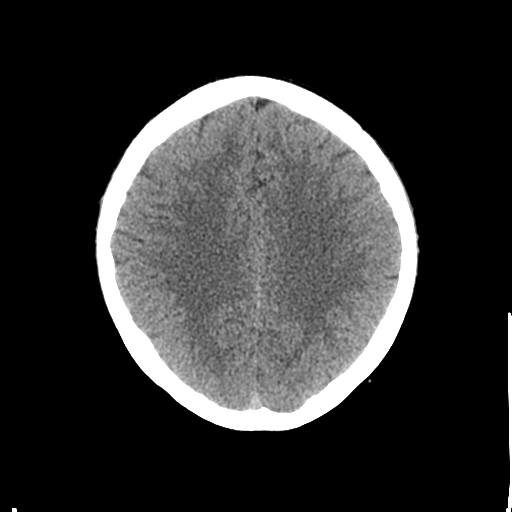
[im 20/29  brain]
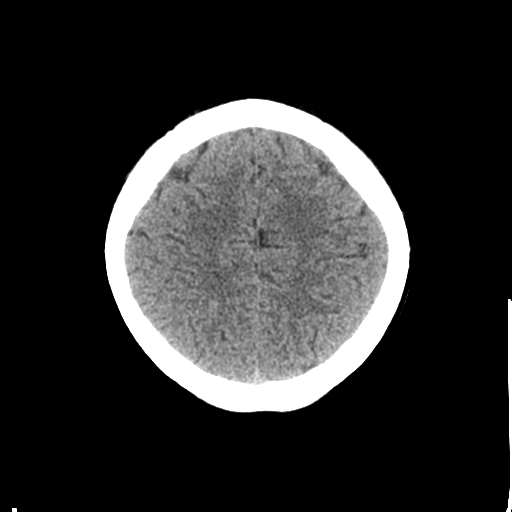
[im 23/29  brain]
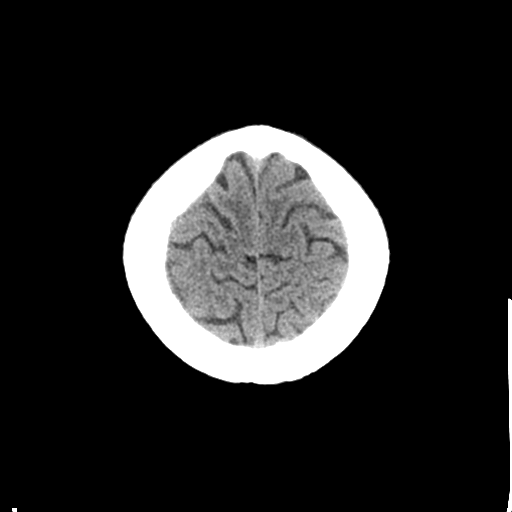
[im 26/29  brain]
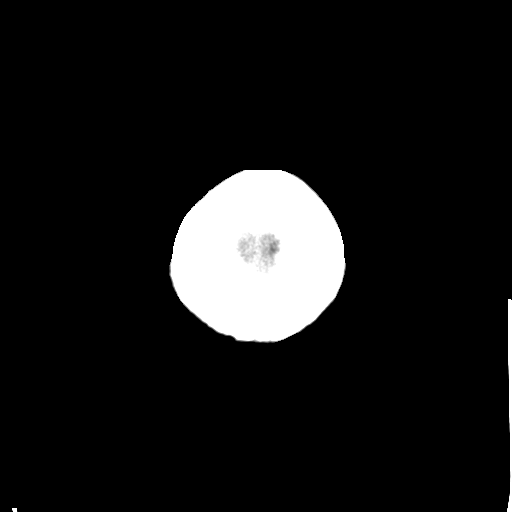
[im 26/29  bone]
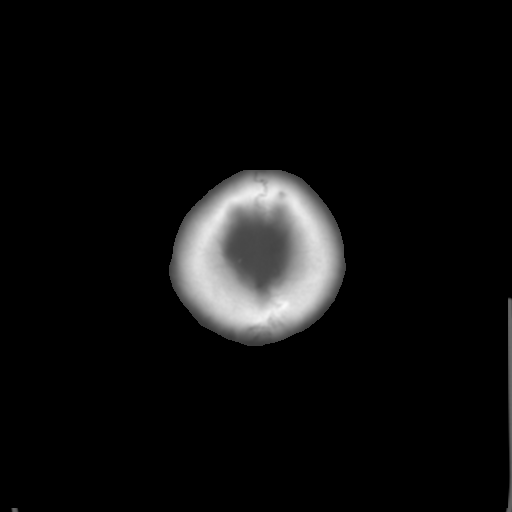

[Series 6: sagittal · sagittal · 0.31mm/px · 3 of 54 slices shown]
[im 18/54  brain]
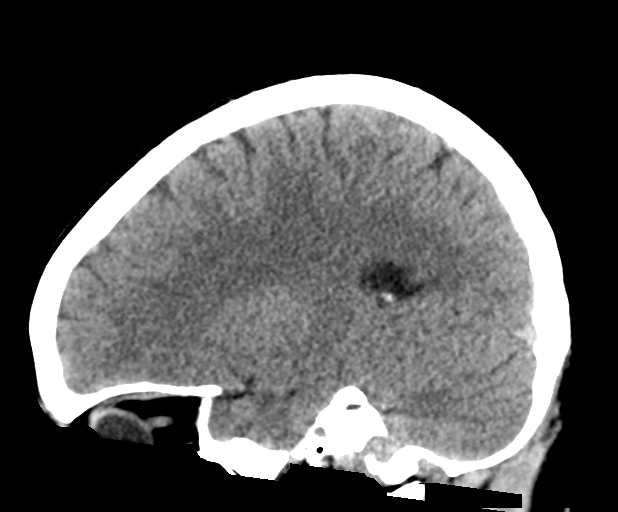
[im 27/54  brain]
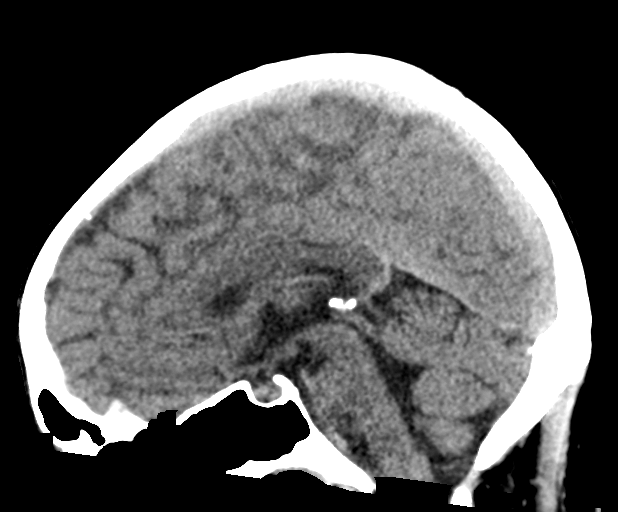
[im 36/54  brain]
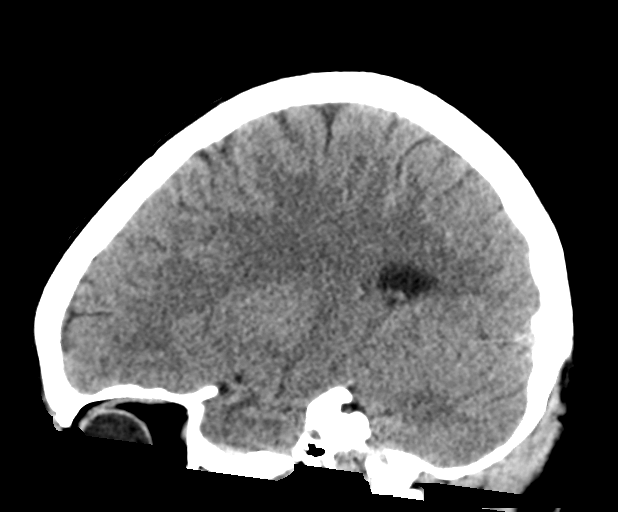

[Series 7: cor head w/o · coronal · non-contrast · 0.27mm/px · 3 of 38 slices shown]
[im 13/38  brain]
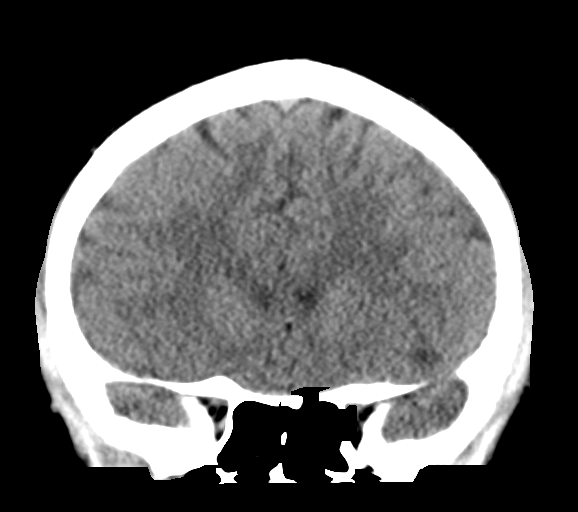
[im 17/38  brain]
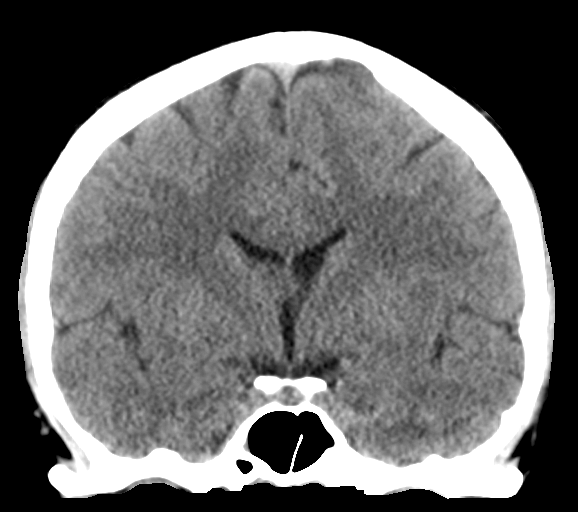
[im 21/38  brain]
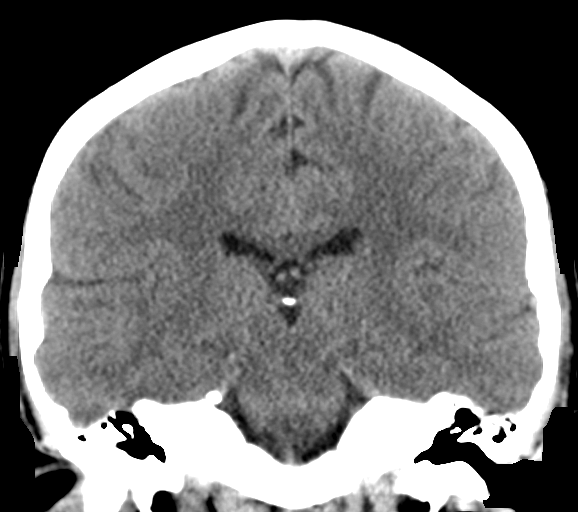

[15 of 47 positions shown; findings below may reference images not displayed]

FINDINGS: Brain: Normal ventricular morphology. No midline shift or mass
effect. Normal appearance of brain parenchyma. No intracranial
hemorrhage, mass lesion, or evidence of acute infarction. No
extra-axial fluid collections.

Vascular: Normal appearance

Skull: Normal appearance

Sinuses/Orbits: Normal appearance

Other: N/A
IMPRESSION: Normal exam.
